# Patient Record
Sex: Female | Born: 1995 | Race: White | Hispanic: No | Marital: Married | Smoking: Never smoker
Health system: Southern US, Community
[De-identification: ages and names within clinical notes are randomized; demographics above are authoritative.]

## PROBLEM LIST (undated history)

## (undated) DIAGNOSIS — J45909 Unspecified asthma, uncomplicated: Secondary | ICD-10-CM

## (undated) HISTORY — PX: ESOPHAGOGASTRODUODENOSCOPY: SHX1529

---
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
## 2016-04-21 ENCOUNTER — Encounter: Payer: Self-pay | Admitting: *Deleted

## 2016-04-21 ENCOUNTER — Ambulatory Visit
Admission: EM | Admit: 2016-04-21 | Discharge: 2016-04-21 | Disposition: A | Discharge status: Home / Self Care | Payer: BC Managed Care – PPO | Attending: Family Medicine | Admitting: Family Medicine

## 2016-04-21 DIAGNOSIS — K1120 Sialoadenitis, unspecified: Secondary | ICD-10-CM

## 2016-04-21 MED ORDER — CIPROFLOXACIN HCL 500 MG PO TABS: 500.0000 mg | ORAL_TABLET | Freq: Two times a day (BID) | ORAL | 0 refills | Status: AC

## 2016-04-21 MED ORDER — CLINDAMYCIN HCL 300 MG PO CAPS: 300.0000 mg | ORAL_CAPSULE | Freq: Three times a day (TID) | ORAL | 0 refills | Status: AC

## 2016-04-21 NOTE — ED Triage Notes (Signed)
Left sided jaw and neck pain x2 days. Denies injury.

## 2016-04-21 NOTE — Discharge Instructions (Signed)
Because you are allergic to Augmentin, I would have to give you both Cipro and Clindamycin. Make sure you take both antibiotic. You may take both at the same time. Taking both antibiotics ensures that we get a good coverage of the bacteria. Follow up with your doctor if you do not improve.

## 2016-04-21 NOTE — ED Provider Notes (Signed)
CSN: [id number]     Arrival date & time 04/21/16  1242 History   First MD Initiated Contact with Patient 04/21/16 1420     Chief Complaint  Patient presents with  . Jaw Pain   (Consider location/radiation/quality/duration/timing/severity/associated sxs/prior Treatment) [name] is a well-appearing 20 y.o. Healthy female, presents today for left jaw pain onset 2 days ago with new onset of jaw swelling onset 1 day ago. Patient reports pain to be sudden onset, constant at 3/10.   She states the pain radiates to her left ear. She reports the area is tender to touch. Patient denies dental pain, fever, headache, dizziness, CP, or SOB. Patient reports Aleve helps the pain some. She reports recent travel to [place].       History reviewed. No pertinent past medical history. History reviewed. No pertinent surgical history. History reviewed. No pertinent family history. Social History  Substance Use Topics  . Smoking status: Never Smoker  . Smokeless tobacco: Never Used  . Alcohol use Yes   OB History    No data available     Review of Systems  All other systems reviewed and are negative.   Allergies  Azithromycin; Amoxicillin; and Amoxicillin-pot clavulanate  Home Medications   Prior to Admission medications   Medication Sig Start Date End Date Taking? Authorizing Provider  desogestrel-ethinyl estradiol (APRI,EMOQUETTE,SOLIA) 0.15-30 MG-MCG tablet Take 1 tablet by mouth daily.   Yes Historical Provider, MD  ciprofloxacin (CIPRO) 500 MG tablet Take 1 tablet (500 mg total) by mouth every 12 (twelve) hours. 04/21/16 04/28/16  Felicia EstelleFeng Jolonda Gomm, NP  clindamycin (CLEOCIN) 300 MG capsule Take 1 capsule (300 mg total) by mouth 3 (three) times daily. 04/21/16 04/28/16  Felicia EstelleFeng Jaylin Roundy, NP   Meds Ordered and Administered this Visit  Medications - No data to display  BP 124/80 (BP Location: Left Arm)   Pulse 99   Temp 97.3 F (36.3 C) (Tympanic)   Resp 16   Ht 5\' 5"  (1.651 m)   Wt 142 lb (64.4  kg)   LMP 04/17/2016 (Exact Date)   SpO2 100%   BMI 23.63 kg/m  No data found.   Physical Exam  Constitutional: She is oriented to person, place, and time. She appears well-developed and well-nourished.  HENT:  Head: Normocephalic and atraumatic.  Right Ear: External ear normal.  Left Ear: External ear normal.  Nose: Nose normal.  Mouth/Throat: Oropharynx is clear and moist. No oropharyngeal exudate.  TM normal bilaterally  Neck: Normal range of motion. Neck supple.  Full ROM at the TMJ. TMJ non-tender to palpate. Left jaw line is slightly swollen and is tender to palpate along the jaw line and at the submandibular region. No oral abnormality noted  Cardiovascular: Normal rate, regular rhythm and normal heart sounds.   Pulmonary/Chest: Effort normal and breath sounds normal. No respiratory distress. She has no wheezes.  Lymphadenopathy:    She has no cervical adenopathy.  Neurological: She is alert and oriented to person, place, and time.  Skin: Skin is warm and dry.  Nursing note and vitals reviewed.   Urgent Care Course   Clinical Course     Procedures (including critical care time)  Labs Review Labs Reviewed - No data to display  Imaging Review No results found.  MDM   1. Parotitis    Prescriptions given (see above). Reviewed directions for usage and side effects. Patient states understanding and will call with questions or problems. Patient instructed to call or follow up with his/her primary care  doctor if failure to improve or change in symptoms. Discharge instruction given.     Felicia EstelleFeng Laine Fonner, NP 04/21/16 1447

## 2017-07-10 ENCOUNTER — Ambulatory Visit (INDEPENDENT_AMBULATORY_CARE_PROVIDER_SITE_OTHER): Payer: BC Managed Care – PPO

## 2017-07-10 ENCOUNTER — Ambulatory Visit
Admission: EM | Admit: 2017-07-10 | Discharge: 2017-07-10 | Disposition: A | Discharge status: Home / Self Care | Payer: BC Managed Care – PPO | Attending: Family Medicine | Admitting: Family Medicine

## 2017-07-10 ENCOUNTER — Other Ambulatory Visit: Payer: Self-pay

## 2017-07-10 ENCOUNTER — Encounter: Payer: Self-pay | Admitting: Emergency Medicine

## 2017-07-10 DIAGNOSIS — S161XXA Strain of muscle, fascia and tendon at neck level, initial encounter: Secondary | ICD-10-CM

## 2017-07-10 DIAGNOSIS — W19XXXA Unspecified fall, initial encounter: Secondary | ICD-10-CM

## 2017-07-10 DIAGNOSIS — S76011A Strain of muscle, fascia and tendon of right hip, initial encounter: Secondary | ICD-10-CM | POA: Diagnosis not present

## 2017-07-10 DIAGNOSIS — W010XXA Fall on same level from slipping, tripping and stumbling without subsequent striking against object, initial encounter: Secondary | ICD-10-CM

## 2017-07-10 DIAGNOSIS — S39012A Strain of muscle, fascia and tendon of lower back, initial encounter: Secondary | ICD-10-CM

## 2017-07-10 DIAGNOSIS — S46911A Strain of unspecified muscle, fascia and tendon at shoulder and upper arm level, right arm, initial encounter: Secondary | ICD-10-CM | POA: Diagnosis not present

## 2017-07-10 DIAGNOSIS — S76012A Strain of muscle, fascia and tendon of left hip, initial encounter: Secondary | ICD-10-CM | POA: Diagnosis not present

## 2017-07-10 HISTORY — DX: Unspecified asthma, uncomplicated: J45.909

## 2017-07-10 LAB — PREGNANCY, URINE: Preg Test, Ur: NEGATIVE

## 2017-07-10 MED ORDER — IBUPROFEN 800 MG PO TABS
800.0000 mg | ORAL_TABLET | Freq: Once | ORAL | Status: AC
  Administered 2017-07-10: 800 mg via ORAL

## 2017-07-10 MED ORDER — CYCLOBENZAPRINE HCL 10 MG PO TABS: 10.0000 mg | ORAL_TABLET | Freq: Every day | ORAL | 0 refills | Status: DC

## 2017-07-10 NOTE — ED Provider Notes (Signed)
[facility]    CSN: [id number] Arrival date & time: 07/10/17  1431     History   Chief Complaint Chief Complaint  Patient presents with  . Shoulder Pain    HPI Erick AlleyMarilyn K Burton is a 22 y.o. female.   22 yo female with a c/o pain to right shoulder, right upper arm, neck, low back and left hip after slipping and falling in a restaurant yesterday. Denies hitting her head, loss of consciousness, shortness of breath.    The history is provided by the patient.  Shoulder Pain    Past Medical History:  Diagnosis Date  . Asthma     There are no active problems to display for this patient.   History reviewed. No pertinent surgical history.  OB History    No data available       Home Medications    Prior to Admission medications   Medication Sig Start Date End Date Taking? Authorizing Provider  albuterol (PROVENTIL HFA;VENTOLIN HFA) 108 (90 Base) MCG/ACT inhaler Inhale 2 puffs into the lungs every 6 (six) hours as needed for wheezing or shortness of breath.   Yes [provider]  cyclobenzaprine (FLEXERIL) 10 MG tablet Take 1 tablet (10 mg total) by mouth at bedtime. 07/10/17   Payton Mccallumonty, Aliahna Statzer, MD  desogestrel-ethinyl estradiol (APRI,EMOQUETTE,SOLIA) 0.15-30 MG-MCG tablet Take 1 tablet by mouth daily.    [provider]    Family History Family History  Problem Relation Age of Onset  . Depression Mother   . Diabetes Father     Social History Social History   Tobacco Use  . Smoking status: Never Smoker  . Smokeless tobacco: Never Used  Substance Use Topics  . Alcohol use: Yes    Comment: social  . Drug use: No     Allergies   Amoxicillin; Azithromycin; Amoxicillin-pot clavulanate; and Bactrim [sulfamethoxazole-trimethoprim]   Review of Systems Review of Systems   Physical Exam Triage Vital Signs ED Triage Vitals  Enc Vitals Group     BP 07/10/17 1501 119/68     Pulse Rate 07/10/17 1501 81     Resp 07/10/17 1501 16     Temp 07/10/17 1501 98.2 F (36.8 C)     Temp Source 07/10/17 1501 Oral     SpO2 07/10/17 1501 100 %     Weight 07/10/17 1500 170 lb (77.1 kg)     Height 07/10/17 1500 5\' 5"  (1.651 m)     Head Circumference --      Peak Flow --      Pain Score 07/10/17 1501 7     Pain Loc --      Pain Edu? --      Excl. in GC? --    No data found.  Updated Vital Signs BP 119/68 (BP Location: Left Arm)   Pulse 81   Temp 98.2 F (36.8 C) (Oral)   Resp 16   Ht 5\' 5"  (1.651 m)   Wt 170 lb (77.1 kg)   LMP 02/13/2017 (Exact Date) Comment: neg preg test  SpO2 100%   BMI 28.29 kg/m   Visual Acuity Right Eye Distance:   Left Eye Distance:   Bilateral Distance:    Right Eye Near:   Left Eye Near:    Bilateral Near:     Physical Exam  Constitutional: She is oriented to person, place, and time. She appears well-developed and well-nourished. No distress.  HENT:  Head: Normocephalic and atraumatic.  Eyes: EOM are normal. Pupils  are equal, round, and reactive to light.  Neck: Normal range of motion. Neck supple. No tracheal deviation present. No thyromegaly present.  Cardiovascular: Normal rate, regular rhythm and normal heart sounds.  Pulmonary/Chest: Effort normal and breath sounds normal. No stridor. No respiratory distress. She has no wheezes. She has no rales.  Musculoskeletal: She exhibits no edema.       Right shoulder: She exhibits decreased range of motion, tenderness and bony tenderness. She exhibits no swelling, no effusion, no crepitus, no deformity, no laceration, no spasm, normal pulse and normal strength.       Left hip: She exhibits tenderness and bony tenderness. She exhibits normal range of motion, normal strength, no swelling, no crepitus, no deformity and no laceration.       Cervical back: She exhibits tenderness, bony tenderness and spasm. She exhibits normal range of motion, no swelling, no edema, no deformity, no laceration and normal pulse.       Lumbar back: She exhibits  tenderness, bony tenderness and spasm. She exhibits normal range of motion, no swelling, no edema, no deformity, no laceration and normal pulse.       Right upper arm: She exhibits tenderness and swelling (and ecchymosis).  Lymphadenopathy:    She has no cervical adenopathy.  Neurological: She is alert and oriented to person, place, and time. She has normal reflexes. No cranial nerve deficit. She exhibits normal muscle tone. Coordination normal.  Skin: She is not diaphoretic.  Nursing note and vitals reviewed.    UC Treatments / Results  Labs (all labs ordered are listed, but only abnormal results are displayed) Labs Reviewed  PREGNANCY, URINE    EKG  EKG Interpretation None       Radiology Dg Cervical Spine Complete  Result Date: 07/10/2017 CLINICAL DATA:  22 year old female status post fall in restaurant onto tiled floor yesterday. Pain. EXAM: CERVICAL SPINE - COMPLETE 4+ VIEW COMPARISON:  None. FINDINGS: Straightening and mild reversal of cervical lordosis. Normal prevertebral soft tissue contour. Cervicothoracic junction alignment is within normal limits. Bilateral posterior element alignment is within normal limits. Normal AP alignment. Normal C1-C2 alignment. Normal C1-C2 joint space, and preserved cervical spine disc spaces. Negative visible upper thorax. IMPRESSION: No acute osseous abnormality identified in the cervical spine. Nonspecific reversal of cervical lordosis. Electronically Signed   By: Odessa Fleming M.D.   On: 07/10/2017 16:40   Dg Lumbar Spine Complete  Result Date: 07/10/2017 CLINICAL DATA:  22 year old female status post fall in restaurant onto tiled floor yesterday. Pain. EXAM: LUMBAR SPINE - COMPLETE 4+ VIEW COMPARISON:  Upper GI series 07/18/2007. Chest radiographs 08/10/2006. 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 Normal lumbar vertebral height and alignment. Relatively preserved disc spaces. No pars fracture. Sacral ala and SI joints appear normal. There is a left side L5-S1 assimilation joint which is non sclerotic. Other visible lower thoracic and pelvic osseous structures appear normal. Negative visible bowel gas pattern and abdominal visceral contours. IMPRESSION: 1.  No osseous abnormality identified in the lumbar spine. 2. 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 Electronically Signed   By: Odessa Fleming M.D.   On: 07/10/2017 16:43   Dg Shoulder Right  Result Date: 07/10/2017 CLINICAL DATA:  23 year old female status post fall in restaurant onto tiled floor yesterday. Pain radiating to the right shoulder and arm. EXAM: RIGHT SHOULDER - 2+ VIEW  COMPARISON:  Cervical spine series today reported separately. Chest radiographs 08/10/2006. FINDINGS: Bone mineralization is within normal limits. No glenohumeral joint dislocation. Intact proximal right humerus. The right clavicle and scapula appear intact and normal. Negative visible right chest. IMPRESSION: Normal radiographic appearance of the right shoulder. Electronically Signed   By: Odessa Fleming M.D.   On: 07/10/2017 16:46   Dg Humerus Right  Result Date: 07/10/2017 CLINICAL DATA:  22 year old female status post fall in restaurant onto tiled floor yesterday. Pain radiating to the right shoulder and arm. EXAM: RIGHT HUMERUS - 2+ VIEW COMPARISON:  Right shoulder series today reported separately. FINDINGS: Bone mineralization is within normal limits. Normal alignment about the right shoulder and elbow. No osseous or soft tissue abnormality identified. IMPRESSION: Negative. Electronically Signed   By: Odessa Fleming M.D.   On: 07/10/2017 16:46   Dg Hip Unilat With Pelvis 2-3 Views Left  Result Date: 07/10/2017 CLINICAL DATA:  22 year old female status post fall in restaurant onto tiled floor yesterday. Pain. EXAM: DG HIP (WITH OR WITHOUT  PELVIS) 2-3V LEFT COMPARISON:  Lumbar radiographs today reported separately. FINDINGS: Bone mineralization is within normal limits. Femoral heads are normally located. Hip joint spaces appear normal. Intact pelvis. Sacrum and SI joints appear normal. Incidental sacralized L5 level with spina bifida occulta again noted (normal variant). Normal visible bowel gas pattern, lower abdominal and pelvic visceral contours. The proximal right femur appears grossly intact. AP and frog-leg lateral views of the left hip are included. The proximal left femur appears intact. IMPRESSION: No acute fracture or dislocation identified about the left hip or pelvis. Electronically Signed   By: Odessa Fleming M.D.   On: 07/10/2017 16:48    Procedures Procedures (including critical care time)  Medications Ordered in UC Medications  ibuprofen (ADVIL,MOTRIN) tablet 800 mg (800 mg Oral Given 07/10/17 1529)     Initial Impression / Assessment and Plan / UC Course  I have reviewed the triage vital signs and the nursing notes.  Pertinent labs & imaging results that were available during my care of the patient were reviewed by me and considered in my medical decision making (see chart for details).      Final Clinical Impressions(s) / UC Diagnoses   Final diagnoses:  Fall  Fall, initial encounter  Strain of right shoulder, initial encounter  Strain of lumbar region, initial encounter  Strain of neck muscle, initial encounter  Strain of left hip, initial encounter    ED Discharge Orders        Ordered    cyclobenzaprine (FLEXERIL) 10 MG tablet  Daily at bedtime     07/10/17 1658     1. x-ray results and diagnosis reviewed with patient 2. rx as per orders above; reviewed possible side effects, interactions, risks and benefits  3. Recommend supportive treatment with rest, ice, otc NSAIDS/analgesics  4. Follow-up prn if symptoms worsen or don't improve  Controlled Substance Prescriptions [place] Controlled Substance  Registry consulted? Not Applicable   Payton Mccallum, MD 07/10/17 [phone]

## 2017-07-10 NOTE — ED Triage Notes (Signed)
Patient in today after falling yesterday on tile floor in a restaurant yesterday (DOI 07/09/17). Patient now c/o bilateral shoulder pain R>L. Neck is sore as well. Left hip pain.

## 2017-07-10 NOTE — Discharge Instructions (Signed)
Rest, ice, ibuprofen 800mg  three times daily

## 2017-07-13 ENCOUNTER — Telehealth: Payer: Self-pay | Admitting: Emergency Medicine

## 2017-07-13 NOTE — Telephone Encounter (Signed)
Called to follow up after patient's recent visit. LM to call with any questions or concerns. 

## 2019-04-08 ENCOUNTER — Encounter: Payer: Self-pay | Admitting: Emergency Medicine

## 2019-04-08 ENCOUNTER — Ambulatory Visit
Admission: EM | Admit: 2019-04-08 | Discharge: 2019-04-08 | Disposition: A | Discharge status: Home / Self Care | Payer: Managed Care, Other (non HMO)

## 2019-04-08 ENCOUNTER — Other Ambulatory Visit: Payer: Self-pay

## 2019-04-08 DIAGNOSIS — H00011 Hordeolum externum right upper eyelid: Secondary | ICD-10-CM

## 2019-04-08 DIAGNOSIS — L03213 Periorbital cellulitis: Secondary | ICD-10-CM

## 2019-04-08 MED ORDER — CLINDAMYCIN HCL 300 MG PO CAPS: 300.0000 mg | ORAL_CAPSULE | Freq: Three times a day (TID) | ORAL | 0 refills | Status: AC

## 2019-04-08 NOTE — ED Provider Notes (Addendum)
[facility] ____________________________________________  Time seen: Approximately 6:33 PM  I have reviewed the triage vital signs and the nursing notes.   HISTORY  Chief Complaint Eyelid Problem (right) and Headache   HPI Felicia York is a 23 y.o. female who is 7 weeks postpartum presenting for evaluation of right upper eyelid redness and swelling.  States the area is tender and sore to touch.  States initially 2 days ago she started having some tenderness and she thought it was a stye however in the last day she has had increased of swelling and redness prompting her to come in.  Has had occasional blurry vision, but not constant.  Denies any vision loss or gaps.  Denies any known injury, scratch, bite or foreign body.  States no pain to eye itself.  Has had scant drainage.  Wears glasses only, no contact use.  Is currently nursing 54-week old.  No recent cough, congestion, sore throat or fevers.  No recent antibiotic use.  Reports otherwise doing well.  No LMP recorded. (Menstrual status: Lactating).  Denies pregnancy.    Past Medical History:  Diagnosis Date   Asthma     There are no active problems to display for this patient.   Past Surgical History:  Procedure Laterality Date   ESOPHAGOGASTRODUODENOSCOPY       No current facility-administered medications for this encounter.   Current Outpatient Medications:    Prenatal Vit-Fe Fumarate-FA (MULTIVITAMIN-PRENATAL) 27-0.8 MG TABS tablet, Take 1 tablet by mouth daily at 12 noon., Disp: , Rfl:    clindamycin (CLEOCIN) 300 MG capsule, Take 1 capsule (300 mg total) by mouth 3 (three) times daily for 7 days., Disp: 21 capsule, Rfl: 0  Allergies Amoxicillin, Azithromycin, Nitrofurantoin, Amoxicillin-pot clavulanate, and Bactrim [sulfamethoxazole-trimethoprim]  Family History  Problem Relation Age of Onset   Depression Mother    Diabetes Father     Social History Social History   Tobacco Use     Smoking status: Never Smoker   Smokeless tobacco: Never Used  Substance Use Topics   Alcohol use: Yes    Comment: social   Drug use: No    Review of Systems Constitutional: No fever/chills Eyes: As above ENT: No sore throat. Cardiovascular: Denies chest pain. Respiratory: Denies shortness of breath. Gastrointestinal: No abdominal pain.   Musculoskeletal: Negative for back pain. Skin: Positive skin changes. ____________________________________________   PHYSICAL EXAM:  VITAL SIGNS: ED Triage Vitals  Enc Vitals Group     BP 04/08/19 1830 118/85     Pulse Rate 04/08/19 1830 79     Resp 04/08/19 1830 16     Temp 04/08/19 1830 98 F (36.7 C)     Temp Source 04/08/19 1830 Oral     SpO2 04/08/19 1830 100 %     Weight 04/08/19 1825 175 lb (79.4 kg)     Height 04/08/19 1825 5\' 5"  (1.651 m)     Head Circumference --      Peak Flow --      Pain Score 04/08/19 1825 4     Pain Loc --      Pain Edu? --      Excl. in GC? --     Constitutional: Alert and oriented. Well appearing and in no acute distress. Eyes: Conjunctivae are normal. PERRL. EOMI. no pain with EOMs.  Right upper eyelid along mid to lateral eyelash margin localized area of mild induration and erythema without pointing or drainage with mild surrounding erythema and edema.  Erythema does  not extend past upper eyelid.  No further surrounding erythema, tenderness, swelling or skin changes noted. ENT      Head: Normocephalic and atraumatic. Hematological/Lymphatic/Immunilogical: No cervical lymphadenopathy. Cardiovascular: Normal rate, regular rhythm. Grossly normal heart sounds.  Good peripheral circulation. Respiratory: Normal respiratory effort without tachypnea nor retractions. Breath sounds are clear and equal bilaterally. No wheezes, rales, rhonchi. Musculoskeletal: Steady gait. Neurologic:  Normal speech and language.  Skin:  Skin is warm, dry and intact. No rash noted. Psychiatric: Mood and affect are  normal. Speech and behavior are normal. Patient exhibits appropriate insight and judgment   ___________________________________________   LABS (all labs ordered are listed, but only abnormal results are displayed)   PROCEDURES Procedures    INITIAL IMPRESSION / ASSESSMENT AND PLAN / ED COURSE  Pertinent labs & imaging results that were available during my care of the patient were reviewed by me and considered in my medical decision making (see chart for details).  Well-appearing patient.  No acute distress.  Right upper eyelid area consistent with a stye however with mild secondary cellulitis.  No pain with EOMs.  Will treat preseptal cellulitis with oral clindamycin, due to patient's multiple antibiotic allergies.  Patient counseled clindamycin being excreted in breast milk, due to child's young age recommend pump and dump during antibiotic therapy.  Patient reports child has been tolerating formula as well.  Discussed immediate reevaluation parameters including seeing ophthalmology for no improvement.Discussed indication, risks and benefits of medications with patient.  Warm compresses, supportive care and good hand hygiene.  Discussed follow up with Primary care physician this week. Discussed follow up and return parameters including no resolution or any worsening concerns. Patient verbalized understanding and agreed to plan.   ____________________________________________   FINAL CLINICAL IMPRESSION(S) / ED DIAGNOSES  Final diagnoses:  Hordeolum externum of right upper eyelid  Preseptal cellulitis of right upper eyelid     ED Discharge Orders         Ordered    clindamycin (CLEOCIN) 300 MG capsule  3 times daily     04/08/19 1847           Note: This dictation was prepared with Dragon dictation along with smaller phrase technology. Any transcriptional errors that result from this process are unintentional.         Marylene Land, NP 04/08/19 1907

## 2019-04-08 NOTE — ED Triage Notes (Signed)
Pt has pain, swelling, in right eyelid. Started about 2 days ago. She states that pain radiates to her cheek bone and up to her hair line.

## 2019-04-08 NOTE — Discharge Instructions (Addendum)
Take medication as prescribed. Rest. Drink plenty of fluids. Warm compresses. Keep hands clean. Pump and discard while on antibiotic due to his age.  Follow up with your primary care physician or ophthalmology this week as needed. Return to Urgent care for new or worsening concerns.

## 2019-09-18 ENCOUNTER — Other Ambulatory Visit: Payer: BC Managed Care – PPO

## 2020-11-02 ENCOUNTER — Emergency Department: Payer: Medicaid Other

## 2020-11-02 ENCOUNTER — Other Ambulatory Visit: Payer: Self-pay

## 2020-11-02 ENCOUNTER — Emergency Department
Admission: EM | Admit: 2020-11-02 | Discharge: 2020-11-02 | Disposition: A | Discharge status: Home / Self Care | Payer: Medicaid Other | Attending: Emergency Medicine | Admitting: Emergency Medicine

## 2020-11-02 ENCOUNTER — Encounter: Payer: Self-pay | Admitting: Emergency Medicine

## 2020-11-02 DIAGNOSIS — R102 Pelvic and perineal pain: Secondary | ICD-10-CM

## 2020-11-02 DIAGNOSIS — O209 Hemorrhage in early pregnancy, unspecified: Secondary | ICD-10-CM

## 2020-11-02 DIAGNOSIS — N939 Abnormal uterine and vaginal bleeding, unspecified: Secondary | ICD-10-CM | POA: Insufficient documentation

## 2020-11-02 DIAGNOSIS — J45909 Unspecified asthma, uncomplicated: Secondary | ICD-10-CM | POA: Insufficient documentation

## 2020-11-02 LAB — BASIC METABOLIC PANEL
Anion gap: 8 (ref 5–15)
BUN: 13 mg/dL (ref 6–20)
CO2: 23 mmol/L (ref 22–32)
Calcium: 8.7 mg/dL — ABNORMAL LOW (ref 8.9–10.3)
Chloride: 105 mmol/L (ref 98–111)
Creatinine, Ser: 0.75 mg/dL (ref 0.44–1.00)
GFR, Estimated: >60 mL/min (ref >60)
Glucose, Bld: 94 mg/dL (ref 70–99)
Potassium: 3.8 mmol/L (ref 3.5–5.1)
Sodium: 136 mmol/L (ref 135–145)

## 2020-11-02 LAB — CBC WITH DIFFERENTIAL/PLATELET
Abs Immature Granulocytes: 0.03 10*3/uL (ref 0.00–0.07)
Basophils Absolute: 0 10*3/uL (ref 0.0–0.1)
Basophils Relative: 0 %
Eosinophils Absolute: 0 10*3/uL (ref 0.0–0.5)
Eosinophils Relative: 0 %
HCT: 38.7 % (ref 36.0–46.0)
Hemoglobin: 13.3 g/dL (ref 12.0–15.0)
Immature Granulocytes: 0 %
Lymphocytes Relative: 21 %
Lymphs Abs: 2.5 10*3/uL (ref 0.7–4.0)
MCH: 31.7 pg (ref 26.0–34.0)
MCHC: 34.4 g/dL (ref 30.0–36.0)
MCV: 92.1 fL (ref 80.0–100.0)
Monocytes Absolute: 0.9 10*3/uL (ref 0.1–1.0)
Monocytes Relative: 7 %
Neutro Abs: 8.4 10*3/uL — ABNORMAL HIGH (ref 1.7–7.7)
Neutrophils Relative %: 72 %
Platelets: 351 10*3/uL (ref 150–400)
RBC: 4.2 MIL/uL (ref 3.87–5.11)
RDW: 12.5 % (ref 11.5–15.5)
WBC: 11.8 10*3/uL — ABNORMAL HIGH (ref 4.0–10.5)
nRBC: 0 % (ref 0.0–0.2)

## 2020-11-02 LAB — HCG, QUANTITATIVE, PREGNANCY: hCG, Beta Chain, Quant, S: 5 m[IU]/mL — ABNORMAL HIGH (ref <5)

## 2020-11-02 LAB — ABO/RH: ABO/RH(D): A POS

## 2020-11-02 NOTE — ED Notes (Signed)
Patient to US

## 2020-11-02 NOTE — Discharge Instructions (Addendum)
Please seek medical attention for any high fevers, chest pain, shortness of breath, change in behavior, persistent vomiting, bloody stool or any other new or concerning symptoms.  

## 2020-11-02 NOTE — ED Notes (Signed)
Patient awaiting [place] results at this time.

## 2020-11-02 NOTE — ED Triage Notes (Signed)
Pt to ED via POV with c/o vaginal bleeding. Pt states is approx [redacted] weeks pregnant, pt states noted bright red bleeding with small clots noted and abd cramping. Pt G2P1L1.

## 2020-11-02 NOTE — ED Provider Notes (Signed)
[facility] Emergency Department Provider Note  ____________________________________________   I have reviewed the triage vital signs and the nursing notes.   HISTORY  Chief Complaint Vaginal Bleeding   History limited by: Not Limited   HPI Felicia York is a 25 y.o. female who presents to the emergency department today because of concern for vaginal bleeding in the setting of early pregnancy. The patient states that she is 5-[redacted] weeks pregnant by last menstrual period. Started having spotting and then vaginal bleeding today. This is accompanied by abdominal cramping. This is the patient's second pregnancy and she denies any complications or problems with her first. The patient denies any known bleeding disorder.    Records reviewed. Per medical record review patient has a history of asthma.   Past Medical History:  Diagnosis Date  . Asthma     There are no problems to display for this patient.   Past Surgical History:  Procedure Laterality Date  . ESOPHAGOGASTRODUODENOSCOPY      Prior to Admission medications   Medication Sig Start Date End Date Taking? Authorizing Provider  Prenatal Vit-Fe Fumarate-FA (MULTIVITAMIN-PRENATAL) 27-0.8 MG TABS tablet Take 1 tablet by mouth daily at 12 noon.    [provider]  albuterol (PROVENTIL HFA;VENTOLIN HFA) 108 (90 Base) MCG/ACT inhaler Inhale 2 puffs into the lungs every 6 (six) hours as needed for wheezing or shortness of breath.  04/08/19  [provider]  desogestrel-ethinyl estradiol (APRI,EMOQUETTE,SOLIA) 0.15-30 MG-MCG tablet Take 1 tablet by mouth daily.  04/08/19  [provider]    Allergies Amoxicillin, Azithromycin, Nitrofurantoin, Amoxicillin-pot clavulanate, and Bactrim [sulfamethoxazole-trimethoprim]  Family History  Problem Relation Age of Onset  . Depression Mother   . Diabetes Father     Social History Social History   Tobacco Use  . Smoking status:  Never Smoker  . Smokeless tobacco: Never Used  Vaping Use  . Vaping Use: Never used  Substance Use Topics  . Alcohol use: Yes    Comment: social  . Drug use: No    Review of Systems Constitutional: No fever/chills Eyes: No visual changes. ENT: No sore throat. Cardiovascular: Denies chest pain. Respiratory: Denies shortness of breath. Gastrointestinal: Positive for abdominal cramping.  Genitourinary: Positive for vaginal bleeding.  Musculoskeletal: Positive for low back pain. Skin: Negative for rash. Neurological: Negative for headaches, focal weakness or numbness.  ____________________________________________   PHYSICAL EXAM:  VITAL SIGNS: ED Triage Vitals  Enc Vitals Group     BP 11/02/20 1611 132/88     Pulse Rate 11/02/20 1611 81     Resp 11/02/20 1611 20     Temp 11/02/20 1611 98.2 F (36.8 C)     Temp Source 11/02/20 1611 Oral     SpO2 11/02/20 1611 100 %     Weight 11/02/20 1609 173 lb (78.5 kg)     Height 11/02/20 1609 5\' 5"  (1.651 m)     Head Circumference --      Peak Flow --      Pain Score 11/02/20 1609 3   Constitutional: Alert and oriented.  Eyes: Conjunctivae are normal.  ENT      Head: Normocephalic and atraumatic.      Nose: No congestion/rhinnorhea.      Mouth/Throat: Mucous membranes are moist.      Neck: No stridor. Hematological/Lymphatic/Immunilogical: No cervical lymphadenopathy. Cardiovascular: Normal rate, regular rhythm.  No murmurs, rubs, or gallops.  Respiratory: Normal respiratory effort without tachypnea nor retractions. Breath sounds are clear and equal  bilaterally. No wheezes/rales/rhonchi. Gastrointestinal: Soft and non tender. No rebound. No guarding.  Genitourinary: Deferred Musculoskeletal: Normal range of motion in all extremities. No lower extremity edema. Neurologic:  Normal speech and language. No gross focal neurologic deficits are appreciated.  Skin:  Skin is warm, dry and intact. No rash noted. Psychiatric: Mood and  affect are normal. Speech and behavior are normal. Patient exhibits appropriate insight and judgment.  ____________________________________________    LABS (pertinent positives/negatives)  HCG 5 BMP wnl except ca 8.7 CBC wbc 11.8, hgb 13.3, plt 351 A POS  ____________________________________________   EKG  None  ____________________________________________    RADIOLOGY  [place] No pregnancy identified  ____________________________________________   PROCEDURES  Procedures  ____________________________________________   INITIAL IMPRESSION / ASSESSMENT AND PLAN / ED COURSE  Pertinent labs & imaging results that were available during my care of the patient were reviewed by me and considered in my medical decision making (see chart for details).   Patient presents to the emergency department today because of concern for possible miscarriage at roughly 5-[redacted] weeks pregnant by dates. bHCG here of 5. [place] without identified pregnancy. At this time I do think patient likely had miscarriage. Discussed with patient findings and possible very early pregnancy. Did recommend ob/gyn follow up.   ____________________________________________   FINAL CLINICAL IMPRESSION(S) / ED DIAGNOSES  Final diagnoses:  Abnormal uterine bleeding     Note: This dictation was prepared with Dragon dictation. Any transcriptional errors that result from this process are unintentional     Phineas Semen, MD 11/02/20 1934

## 2020-11-03 LAB — POC URINE PREG, ED: Preg Test, Ur: NEGATIVE

## 2022-09-09 IMAGING — US US OB < 14 WEEKS - US OB TV
1 series · 15 of 28 positions shown · non-contrast
Comparison: None.

CLINICAL DATA: Initial evaluation for acute vaginal bleeding,
pelvic cramping for 1 day, positive beta HCG.

EXAM:
OBSTETRIC <14 WK US AND TRANSVAGINAL OB US
TECHNIQUE: Both transabdominal and transvaginal ultrasound examinations were
performed for complete evaluation of the gestation as well as the
maternal uterus, adnexal regions, and pelvic cul-de-sac.
Transvaginal technique was performed to assess early pregnancy.

[Series 1: us ob comp less 14 wks · 15 of 137 slices shown]
[im 1/137]
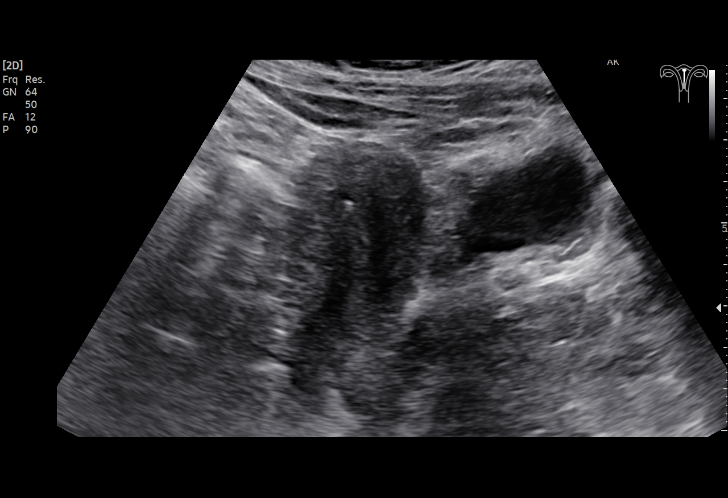
[im 11/137]
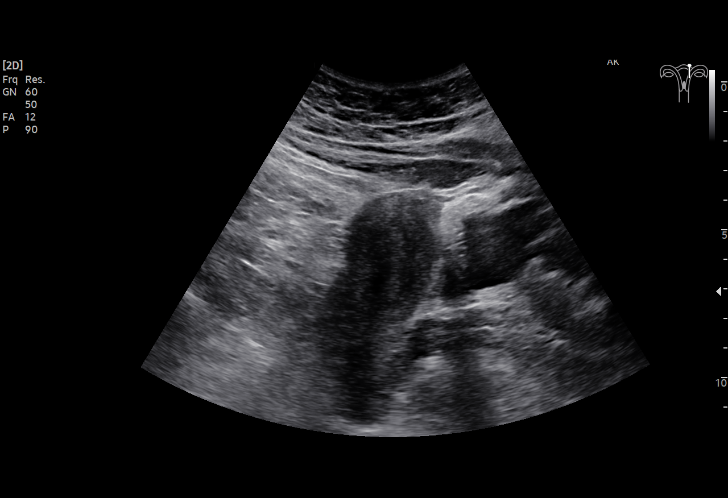
[im 21/137]
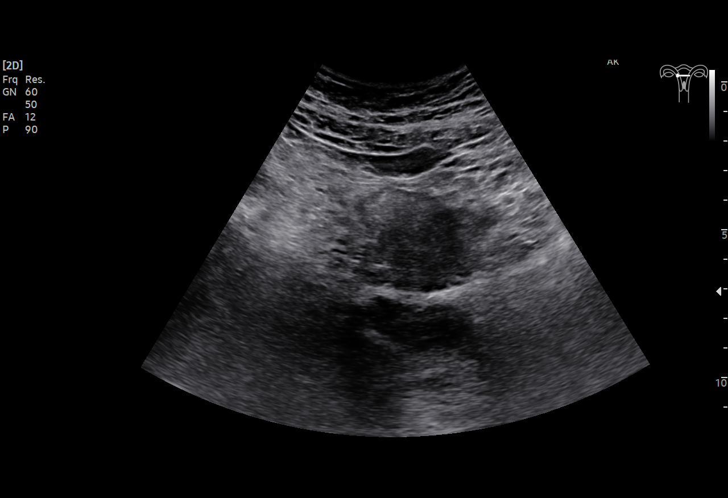
[im 31/137]
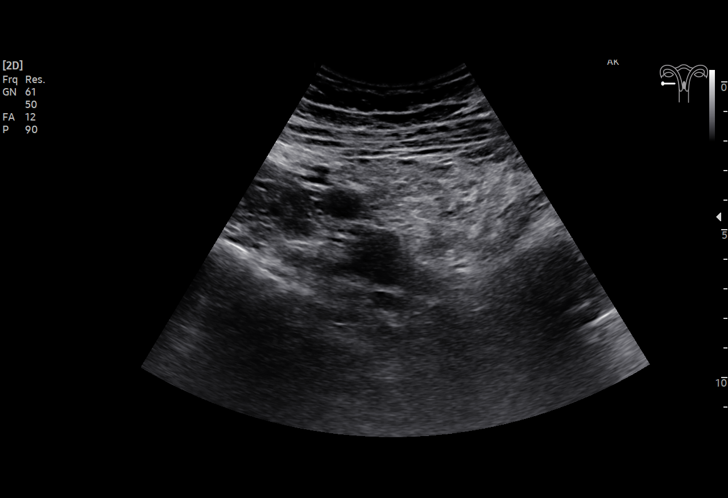
[im 41/137]
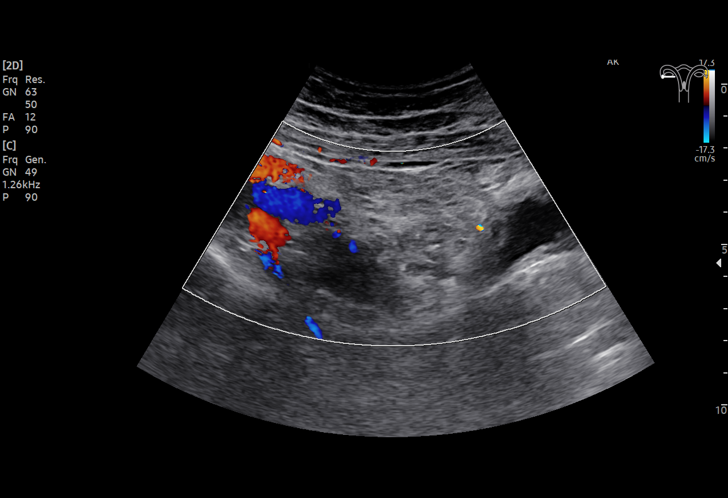
[im 51/137]
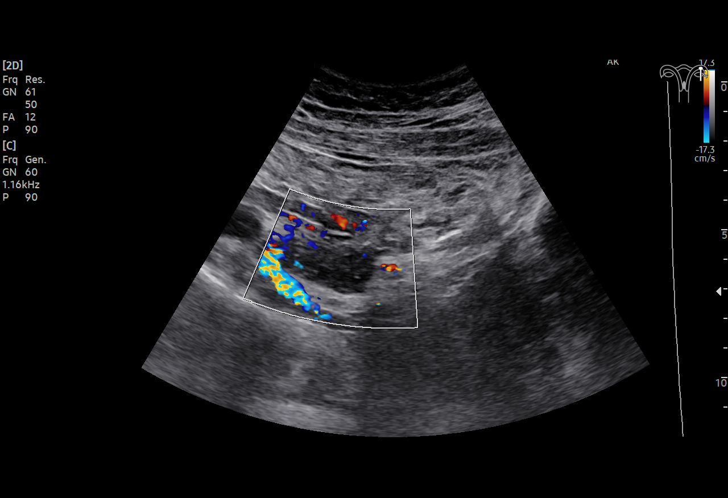
[im 61/137]
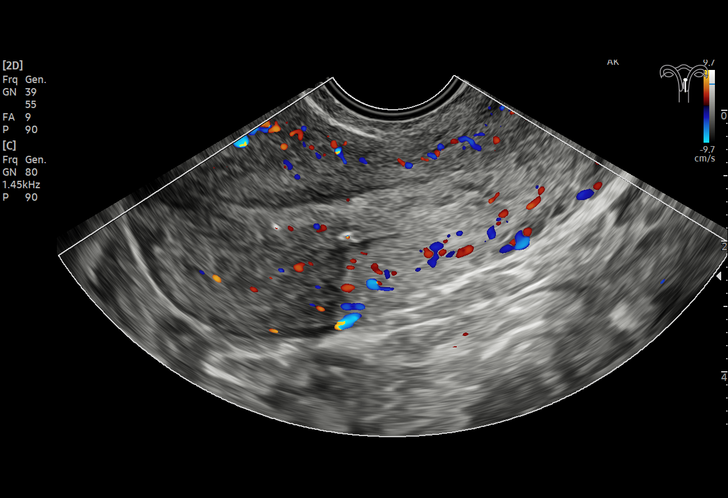
[im 71/137]
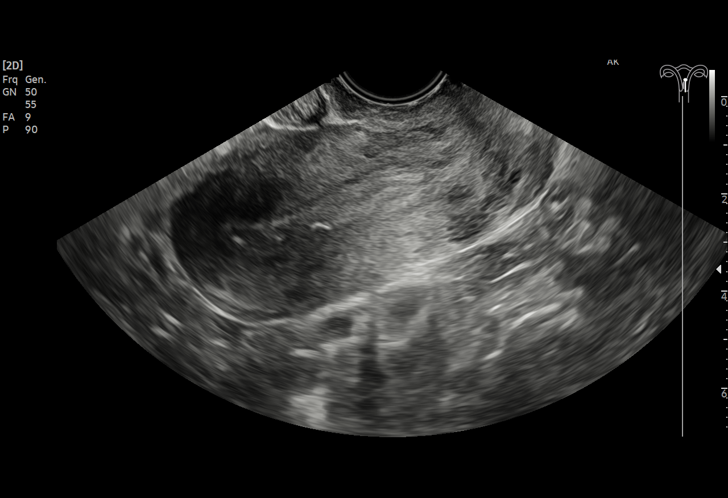
[im 76/137]
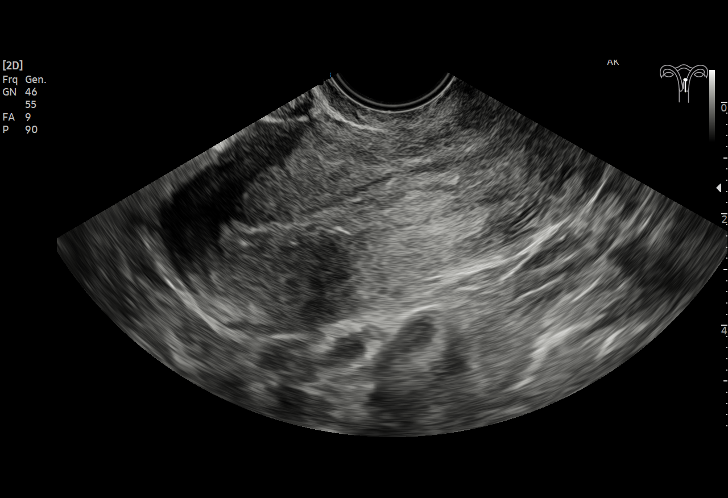
[im 86/137]
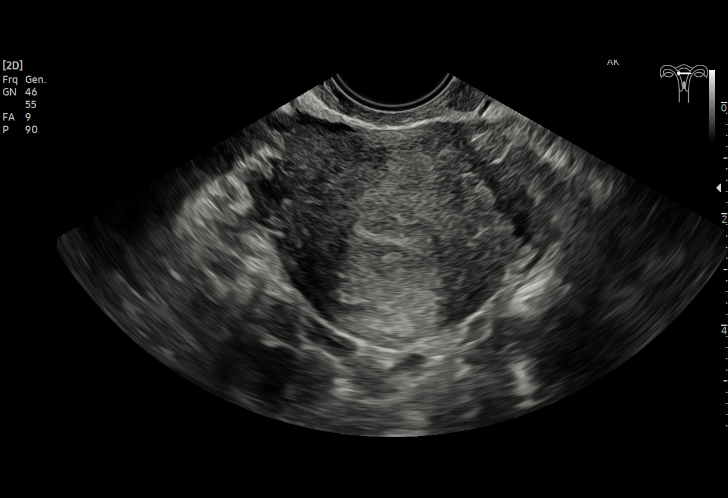
[im 96/137]
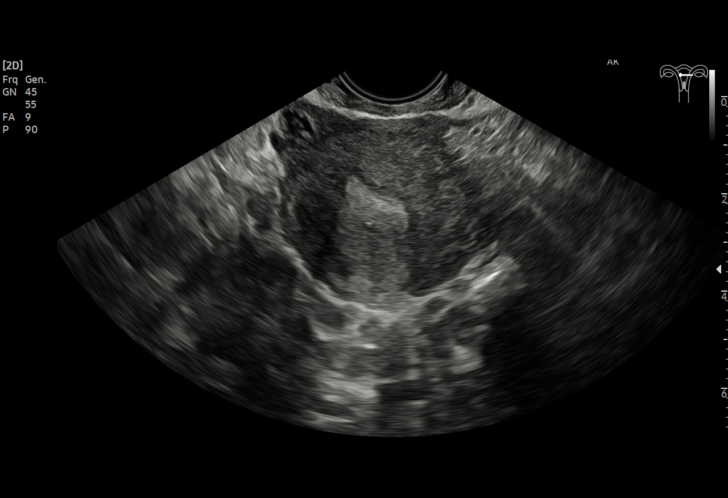
[im 106/137]
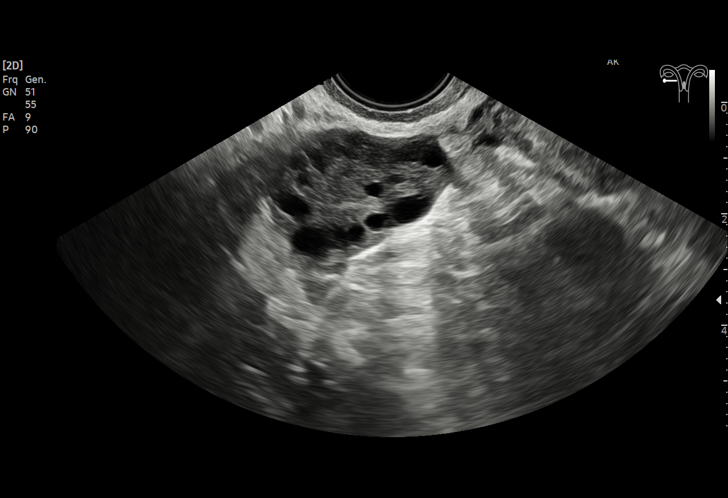
[im 116/137]
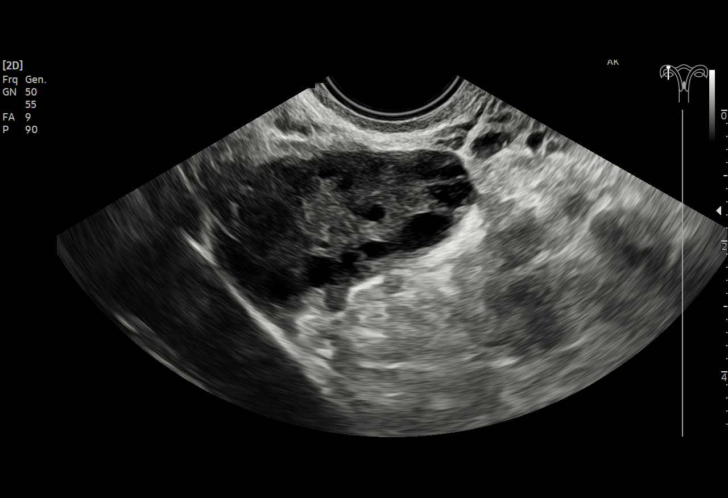
[im 126/137]
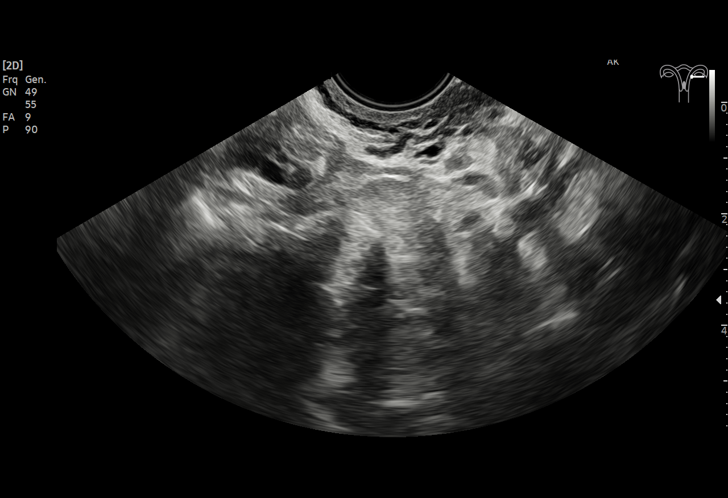
[im 137/137]
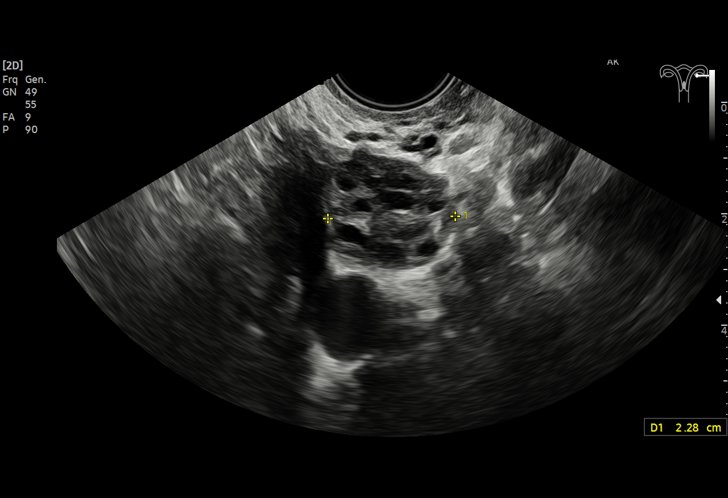

[15 of 28 positions shown; findings below may reference images not displayed]

FINDINGS: Intrauterine gestational sac: Negative.

Yolk sac:  Negative.

Embryo:  Negative.

Cardiac Activity: Negative.

Subchorionic hemorrhage:  None visualized.

Maternal uterus/adnexae: Ovaries within normal limits bilaterally.
No adnexal mass or free fluid.

Uterus within normal limits. Endometrial stripe measures 6.4 mm in
thickness. Few small echogenic foci about the endometrial complex
suggestive of small microcalcifications.
IMPRESSION: 1. Early pregnancy with no discrete IUP or adnexal mass identified.
Finding is consistent with a pregnancy of unknown anatomic location.
Differential considerations include IUP to early to visualize,
recent SAB, or possibly occult ectopic pregnancy. Close clinical
monitoring with serial beta HCGs and close interval follow-up
ultrasound recommended as clinically warranted.
2. No other acute maternal uterine or adnexal abnormality.

## 2023-03-17 ENCOUNTER — Encounter: Payer: Self-pay | Admitting: Emergency Medicine

## 2023-03-17 ENCOUNTER — Ambulatory Visit
Admission: EM | Admit: 2023-03-17 | Discharge: 2023-03-17 | Disposition: A | Discharge status: Home / Self Care | Payer: Managed Care, Other (non HMO) | Attending: Family Medicine | Admitting: Family Medicine

## 2023-03-17 DIAGNOSIS — J029 Acute pharyngitis, unspecified: Secondary | ICD-10-CM | POA: Diagnosis not present

## 2023-03-17 LAB — GROUP A STREP BY PCR: Group A Strep by PCR: NOT DETECTED

## 2023-03-17 LAB — MONONUCLEOSIS SCREEN: Mono Screen: NEGATIVE

## 2023-03-17 NOTE — ED Triage Notes (Signed)
Patient c/o sore throat and white patches on her tonsils since yesterday.  Patient denies fevers.

## 2023-03-17 NOTE — ED Provider Notes (Signed)
[facility]    CSN: [id number] Arrival date & time: 03/17/23  0920      History   Chief Complaint Chief Complaint  Patient presents with   Sore Throat    HPI Felicia York is a 27 y.o. female.   HPI  History obtained from the patient. Felicia York presents for sore throat that started yesterday.  She had some pain with swallowing when she woke up.  She noticed there were some white patches in the back of her throat but not on the tonsils.  She took some Tylenol as she felt like she had a fever.  Requested strep test as she does not want to give anybody anything and she has a pregnant family member and immunocompromised friend.      Past Medical History:  Diagnosis Date   Asthma     There are no problems to display for this patient.   Past Surgical History:  Procedure Laterality Date   ESOPHAGOGASTRODUODENOSCOPY      OB History     Gravida  1   Para      Term      Preterm      AB      Living         SAB      IAB      Ectopic      Multiple      Live Births               Home Medications    Prior to Admission medications   Medication Sig Start Date End Date Taking? Authorizing Provider  Prenatal Vit-Fe Fumarate-FA (MULTIVITAMIN-PRENATAL) 27-0.8 MG TABS tablet Take 1 tablet by mouth daily at 12 noon.    [provider]  albuterol (PROVENTIL HFA;VENTOLIN HFA) 108 (90 Base) MCG/ACT inhaler Inhale 2 puffs into the lungs every 6 (six) hours as needed for wheezing or shortness of breath.  04/08/19  [provider]  desogestrel-ethinyl estradiol (APRI,EMOQUETTE,SOLIA) 0.15-30 MG-MCG tablet Take 1 tablet by mouth daily.  04/08/19  [provider]    Family History Family History  Problem Relation Age of Onset   Depression Mother    Diabetes Father     Social History Social History   Tobacco Use   Smoking status: Never   Smokeless tobacco: Never  Vaping Use   Vaping status: Never Used   Substance Use Topics   Alcohol use: Yes    Comment: social   Drug use: No     Allergies   Amoxicillin, Azithromycin, Nitrofurantoin, Amoxicillin-pot clavulanate, and Bactrim [sulfamethoxazole-trimethoprim]   Review of Systems Review of Systems: negative unless otherwise stated in HPI.      Physical Exam Triage Vital Signs ED Triage Vitals  Encounter Vitals Group     BP 03/17/23 0928 117/81     Systolic BP Percentile --      Diastolic BP Percentile --      Pulse Rate 03/17/23 0928 98     Resp 03/17/23 0928 14     Temp 03/17/23 0928 98.2 F (36.8 C)     Temp Source 03/17/23 0928 Oral     SpO2 03/17/23 0928 96 %     Weight 03/17/23 0926 143 lb (64.9 kg)     Height 03/17/23 0926 5\' 5"  (1.651 m)     Head Circumference --      Peak Flow --      Pain Score 03/17/23 0926 5     Pain Loc --  Pain Education --      Exclude from Growth Chart --    No data found.  Updated Vital Signs BP 117/81 (BP Location: Right Arm)   Pulse 98   Temp 98.2 F (36.8 C) (Oral)   Resp 14   Ht 5\' 5"  (1.651 m)   Wt 64.9 kg   LMP 02/17/2023 (Approximate)   SpO2 96%   Breastfeeding No   BMI 23.80 kg/m   Visual Acuity Right Eye Distance:   Left Eye Distance:   Bilateral Distance:    Right Eye Near:   Left Eye Near:    Bilateral Near:     Physical Exam GEN:     alert, non-toxic appearing female in no distress    HENT:  mucus membranes moist, oropharyngeal without lesions, moderate erythema, no tonsillar hypertrophy, pharyngeal exudates, no nasal discharge EYES:   pupils equal and reactive, no scleral injection or discharge NECK:  normal ROM, +lymphadenopathy, no meningismus   RESP:  no increased work of breathing Skin:   warm and dry, no rash on visible skin    UC Treatments / Results  Labs (all labs ordered are listed, but only abnormal results are displayed) Labs Reviewed  GROUP A STREP BY PCR  MONONUCLEOSIS SCREEN    EKG   Radiology No results  found.  Procedures Procedures (including critical care time)  Medications Ordered in UC Medications - No data to display  Initial Impression / Assessment and Plan / UC Course  I have reviewed the triage vital signs and the nursing notes.  Pertinent labs & imaging results that were available during my care of the patient were reviewed by me and considered in my medical decision making (see chart for details).       Pt is a 27 y.o. female who presents for 2 days of sore throat with white patches in the back of her throat. [place] is afebrile here. Satting well on room air. Overall pt is non-toxic appearing, well hydrated, without respiratory distress.  She has oropharyngeal erythema and some exudates present but no tonsillar hypertrophy.  Strep PCR obtained and was negative.  Discussed mono testing and she is agreeable.  Monotest is negative.   History consistent with viral pharyngitis. Discussed symptomatic treatment.  Explained lack of efficacy of antibiotics in viral disease.  Typical duration of symptoms discussed.   Return and ED precautions given and voiced understanding. Discussed MDM, treatment plan and plan for follow-up with patient who agrees with plan.     Final Clinical Impressions(s) / UC Diagnoses   Final diagnoses:  Pharyngitis, unspecified etiology     Discharge Instructions      Your strep test is negative.  I will call you if your monotest is positive.   You can take Tylenol and/or Ibuprofen as needed for fever reduction and pain relief.    For sore throat: try warm salt water gargles, Mucinex sore throat cough drops or cepacol lozenges, throat spray, warm tea or water with lemon/honey, popsicles or ice, or OTC cold relief medicine for throat discomfort. You can also purchase chloraseptic spray at the pharmacy or dollar store.   It is important to stay hydrated: drink plenty of fluids (water, gatorade/powerade/pedialyte, juices, or teas) to keep your throat  moisturized and help further relieve irritation/discomfort.    Return or go to the Emergency Department if symptoms worsen or do not improve in the next few days      ED Prescriptions   None  PDMP not reviewed this encounter.   Katha Cabal, DO 03/17/23 1152

## 2023-03-17 NOTE — Discharge Instructions (Addendum)
Your strep test is negative.  I will call you if your monotest is positive.   You can take Tylenol and/or Ibuprofen as needed for fever reduction and pain relief.    For sore throat: try warm salt water gargles, Mucinex sore throat cough drops or cepacol lozenges, throat spray, warm tea or water with lemon/honey, popsicles or ice, or OTC cold relief medicine for throat discomfort. You can also purchase chloraseptic spray at the pharmacy or dollar store.   It is important to stay hydrated: drink plenty of fluids (water, gatorade/powerade/pedialyte, juices, or teas) to keep your throat moisturized and help further relieve irritation/discomfort.    Return or go to the Emergency Department if symptoms worsen or do not improve in the next few days

## 2024-01-21 LAB — PANORAMA PRENATAL TEST FULL PANEL:PANORAMA TEST PLUS 5 ADDITIONAL MICRODELETIONS: FETAL FRACTION: 11.8
